# Patient Record
Sex: Male | Born: 1975 | Hispanic: Yes | Marital: Married | Smoking: Current every day smoker
Health system: Southern US, Community
[De-identification: ages and names within clinical notes are randomized; demographics above are authoritative.]

---

## 2016-10-04 DIAGNOSIS — M25531 Pain in right wrist: Secondary | ICD-10-CM

## 2016-10-19 DIAGNOSIS — M25531 Pain in right wrist: Secondary | ICD-10-CM | POA: Diagnosis not present

## 2016-10-19 NOTE — Progress Notes (Signed)
   Office Visit Note   Patient: Allen Bryant           Date of Birth: 03/31/1976           MRN: [id number] Visit Date: 10/19/2016              Requested by: No referring provider defined for this encounter. PCP: No primary care provider on file.   Assessment & Plan: Visit Diagnoses: No diagnosis found.  Plan: Total face to face encounter time was greater than 45 minutes and over half of this time was spent in counseling and/or coordination of care.  continue light duty at this point. Recommend MRI of the right wrist to evaluate for extensor tendon tear Follow-up after MRI.  Follow-Up Instructions: No Follow-up on file.   Orders:  No orders of the defined types were placed in this encounter.  No orders of the defined types were placed in this encounter.     Procedures: No procedures performed   Clinical Data: No additional findings.   Subjective: Chief Complaint  Patient presents with  . Right Wrist - Cyst, Pain    Mr. Allen Bryant is a Spanish-speaking only healthy gentleman who comes in with right wrist pain from 10/04/2016. He was on the job when he was lifting something and he felt an immediate pain. His pain is 3 out of 10 and that comes and goes and is sharp at times. He endorses occasional tingling. Currently working light duty. He does not use his right hand. He's been using a brace. He feels that he is a little bit better. He's been using warm compresses and salt soaks. Ibuprofen is also given him partial relief. Denies any constitutional symptoms.    Review of Systems  Constitutional: Negative.   HENT: Negative.   Eyes: Negative.   Respiratory: Negative.   Cardiovascular: Negative.   Gastrointestinal: Negative.   Endocrine: Negative.   Genitourinary: Negative.   Musculoskeletal: Negative.   Skin: Negative.   Allergic/Immunologic: Negative.   Neurological: Negative.   Hematological: Negative.   Psychiatric/Behavioral: Negative.      Objective: Vital  Signs: There were no vitals taken for this visit.  Physical Exam  Constitutional: He is oriented to person, place, and time. He appears well-developed and well-nourished.  HENT:  Head: Normocephalic and atraumatic.  Eyes: EOM are normal.  Neck: Neck supple.  Cardiovascular: Intact distal pulses.   Pulmonary/Chest: Effort normal.  Abdominal: Soft.  Neurological: He is alert and oriented to person, place, and time.  Skin: Skin is warm.  Psychiatric: He has a normal mood and affect. His behavior is normal. Judgment and thought content normal.  Nursing note and vitals reviewed.   Ortho Exam Exam of the right hand and wrist shows weak wrist extension. He has pain with resisted third and second finger extension. Negative Finkelstein's negative intersection syndrome nontender over the scaphoid. He has no focal deficits. Specialty Comments:  No specialty comments available.  Imaging: No results found.   PMFS History: There are no active problems to display for this patient.  No past medical history on file.  No family history on file.  No past surgical history on file. Social History   Occupational History  . Not on file.   Social History Main Topics  . Smoking status: Former Games developermoker  . Smokeless tobacco: Never Used  . Alcohol use Not on file  . Drug use: Unknown  . Sexual activity: Not on file

## 2016-11-02 DIAGNOSIS — M25531 Pain in right wrist: Secondary | ICD-10-CM

## 2016-11-02 NOTE — Progress Notes (Signed)
Patient rescheduled pending MRI approval by workers comp

## 2016-11-25 NOTE — Telephone Encounter (Signed)
Workers comp. Adjuster called this morning to see if patient MRI was in the system so when he comes in for followup everything goes smoothly.  He had it done at [facility] so I told her to make sure he brings a copy on disk form to be on the safe side.  If you can see it in the system please call Belva Ageeeggy Baker [phone] ext [phone].

## 2016-11-26 DIAGNOSIS — M25531 Pain in right wrist: Secondary | ICD-10-CM | POA: Diagnosis not present

## 2016-12-01 NOTE — Telephone Encounter (Signed)
Patient was seen already.

## 2016-12-17 NOTE — Telephone Encounter (Signed)
Pt adjuster requesting Diction for 12/29 and office note  In english  Needs Treatment plan  [place]   Fax [phone]

## 2016-12-17 NOTE — Telephone Encounter (Signed)
Allen Bryant with [employer] [facility] called needing the 11/26/16 office note faxed to her. The fax# is [phone]   The ph# is [phone]

## 2016-12-17 NOTE — Telephone Encounter (Signed)
faxed

## 2016-12-21 NOTE — Telephone Encounter (Signed)
[place] [phone]

## 2016-12-21 NOTE — Telephone Encounter (Signed)
Received vm from case mgr Melody harris requesting 11/26/16 office note. Faxed to her @ [phone]

## 2016-12-22 NOTE — Telephone Encounter (Signed)
Faxed the notes to her.

## 2016-12-22 NOTE — Telephone Encounter (Signed)
[employer] carrier asking for last ov notes and any orders.  Please Fax to  [phone]  Attn:  Vernona RiegerLaura

## 2016-12-23 NOTE — Telephone Encounter (Signed)
Therapist called and requested results of MRI to be faxed to them this morning fax # [phone]. I looked in the imaging tab and did not see a result or I would have faxed it myself.

## 2016-12-23 NOTE — Telephone Encounter (Signed)
FAXED MRI REPORT TO [phone]

## 2016-12-31 NOTE — Telephone Encounter (Signed)
Refaxed all notes and order for Pt today to fax number [phone]. I had already faxed them once.

## 2017-01-10 DIAGNOSIS — M25531 Pain in right wrist: Secondary | ICD-10-CM | POA: Diagnosis not present

## 2017-01-14 NOTE — Telephone Encounter (Signed)
Pascal LuxLaura Garner from [facility] called this afternoon in regards to needing office notes for 01/10/17. Her CB # [phone]. Thank you   [phone] fax

## 2017-01-14 NOTE — Telephone Encounter (Signed)
Faxed note to [phone]

## 2017-02-21 DIAGNOSIS — B9789 Other viral agents as the cause of diseases classified elsewhere: Secondary | ICD-10-CM

## 2017-02-21 DIAGNOSIS — M25531 Pain in right wrist: Secondary | ICD-10-CM

## 2017-02-21 DIAGNOSIS — J069 Acute upper respiratory infection, unspecified: Secondary | ICD-10-CM

## 2017-02-21 NOTE — Discharge Instructions (Signed)
You most likely have a viral URI, I advise rest, plenty of fluids and management of symptoms with over the counter medicines. For symptoms you may take Tylenol as needed every 4-6 hours for body aches or fever, not to exceed 4,000 mg a day, Take mucinex or mucinex DM ever 12 hours with a full glass of water, you may use an inhaled steroid such as Flonase, 2 sprays each nostril once a day for congestion, or an antihistamine such as Claritin or Zyrtec once a day. For Nausea, I have prescribed Zofran, take 1 tablet under the tongue every 8 hours as needed. For cough, I have prescribed a medication called Tessalon. Take 1 tablet every 8 hours as needed for your cough.  For pain I have prescribed Diclofenac, take 1 tablet twice a day. Should your symptoms worsen or fail to resolve, follow up with your primary care provider or return to clinic.

## 2017-02-21 NOTE — ED Triage Notes (Signed)
Pt has been suffering from nasal congestion and drainage, a cough and a headache for 4 days.

## 2017-03-03 DIAGNOSIS — M25531 Pain in right wrist: Secondary | ICD-10-CM | POA: Diagnosis not present

## 2017-03-21 DIAGNOSIS — M25531 Pain in right wrist: Secondary | ICD-10-CM | POA: Diagnosis not present

## 2017-07-09 IMAGING — CR DG WRIST COMPLETE 3+V*R*
4 series · 4 of 4 positions shown · non-contrast
Comparison: None.

CLINICAL DATA: Initial evaluation for acute injury to right wrist,
now with pain and swelling.

EXAM:
RIGHT WRIST - COMPLETE 3+ VIEW

[view not recorded (1 of 4)]
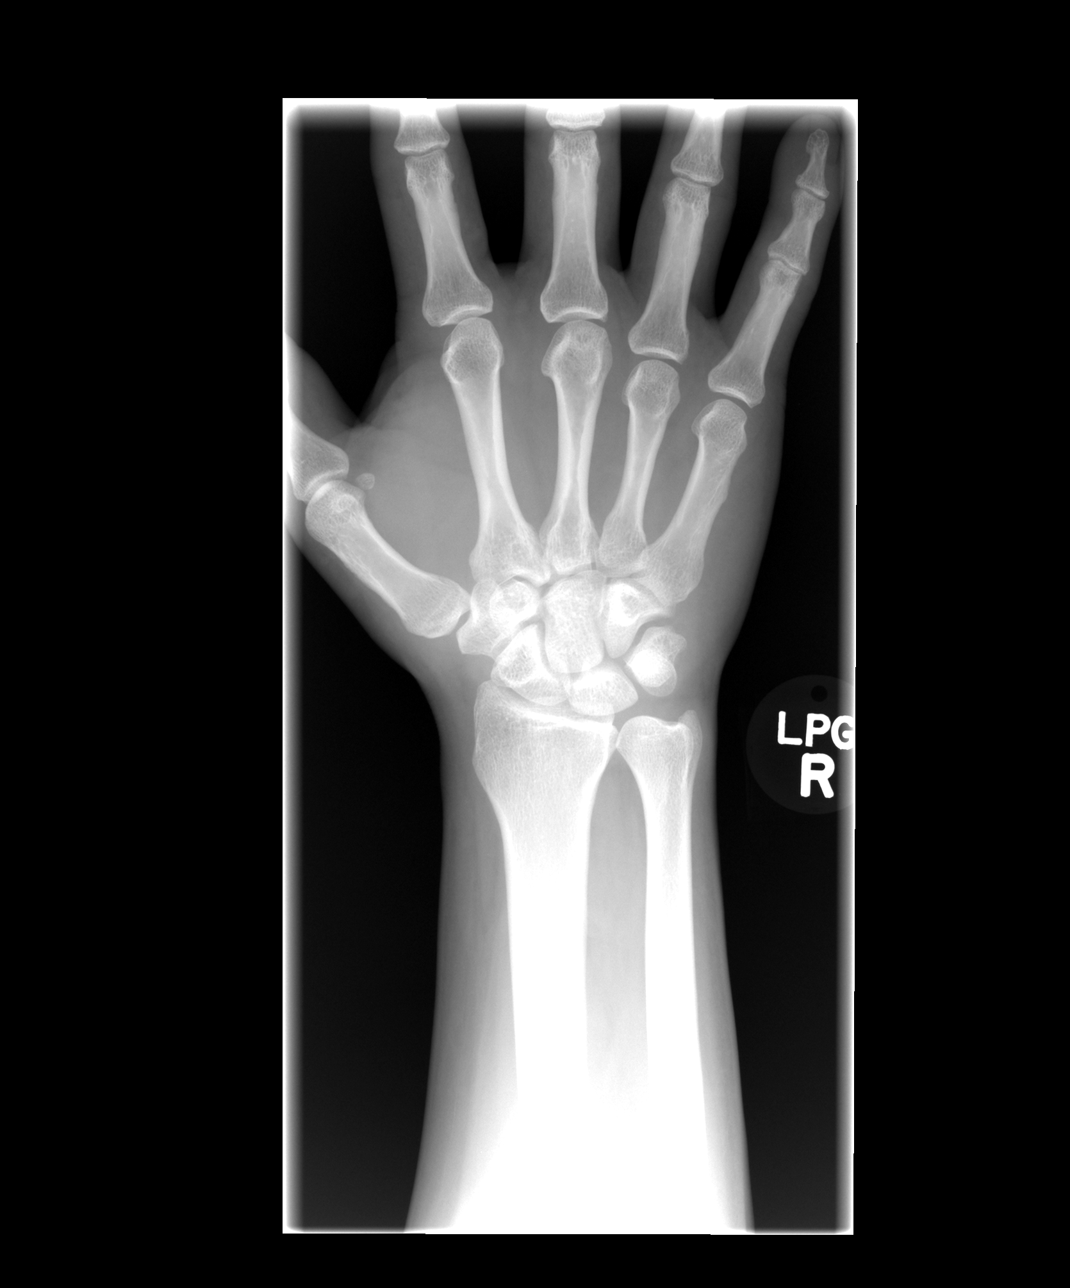

[view not recorded (2 of 4)]
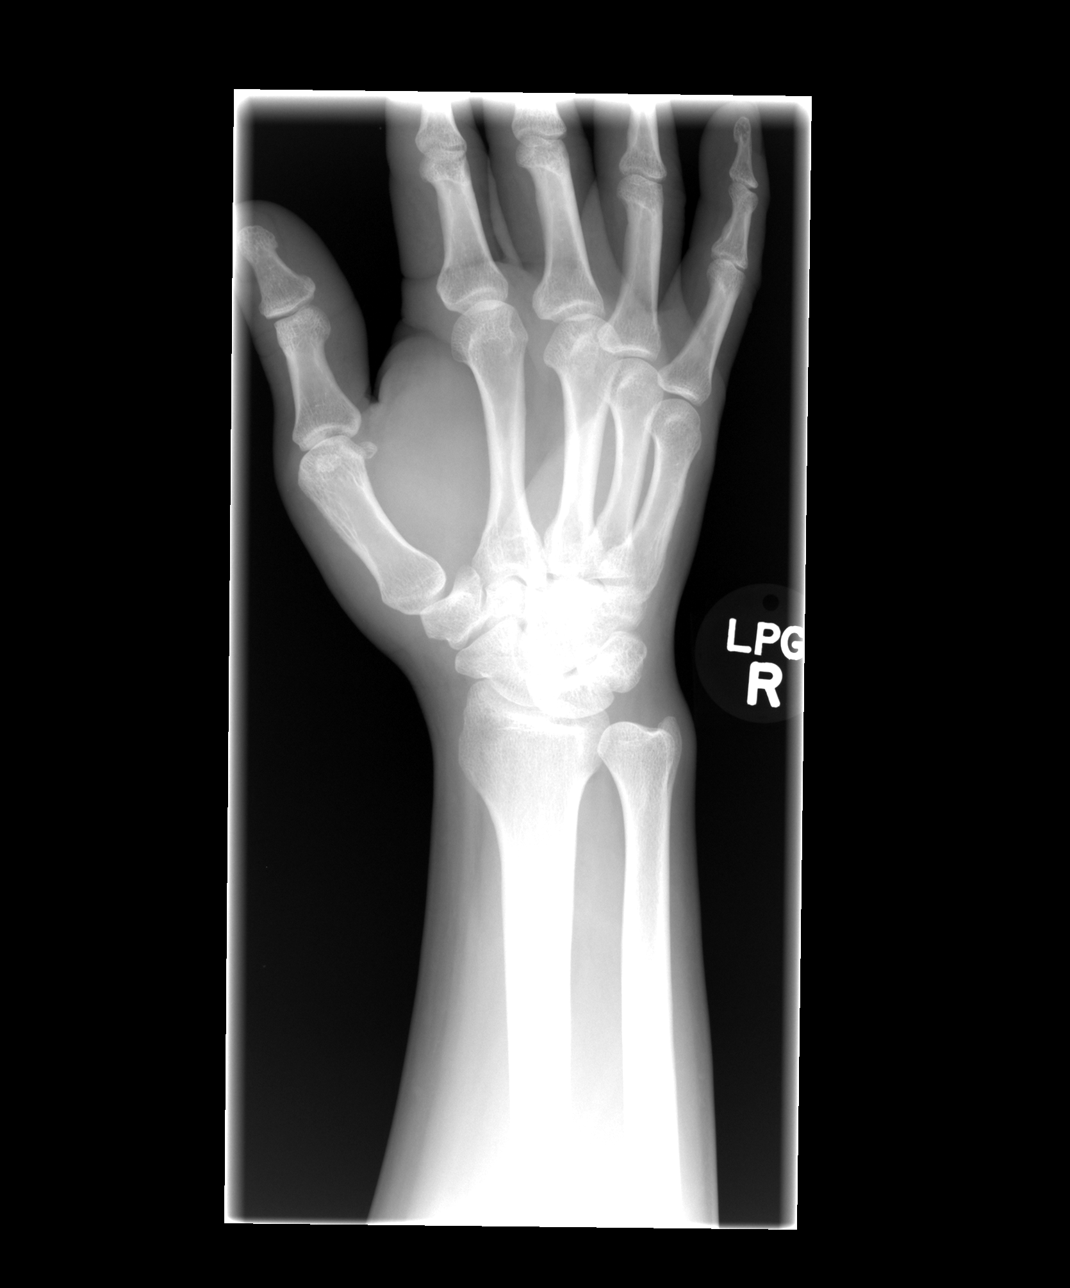

[view not recorded (3 of 4)]
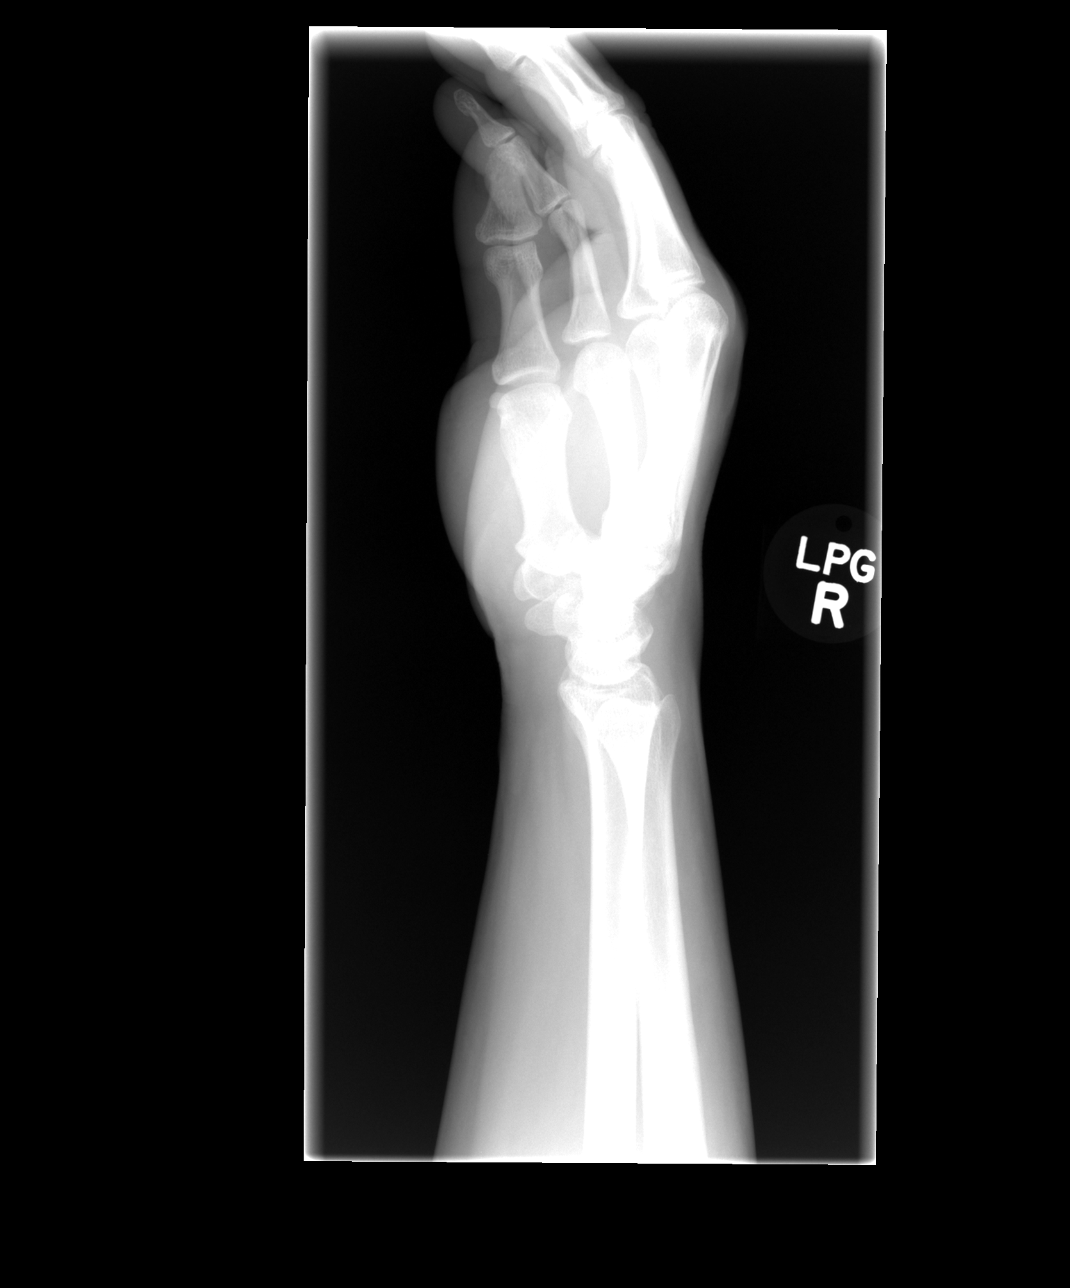

[view not recorded (4 of 4)]
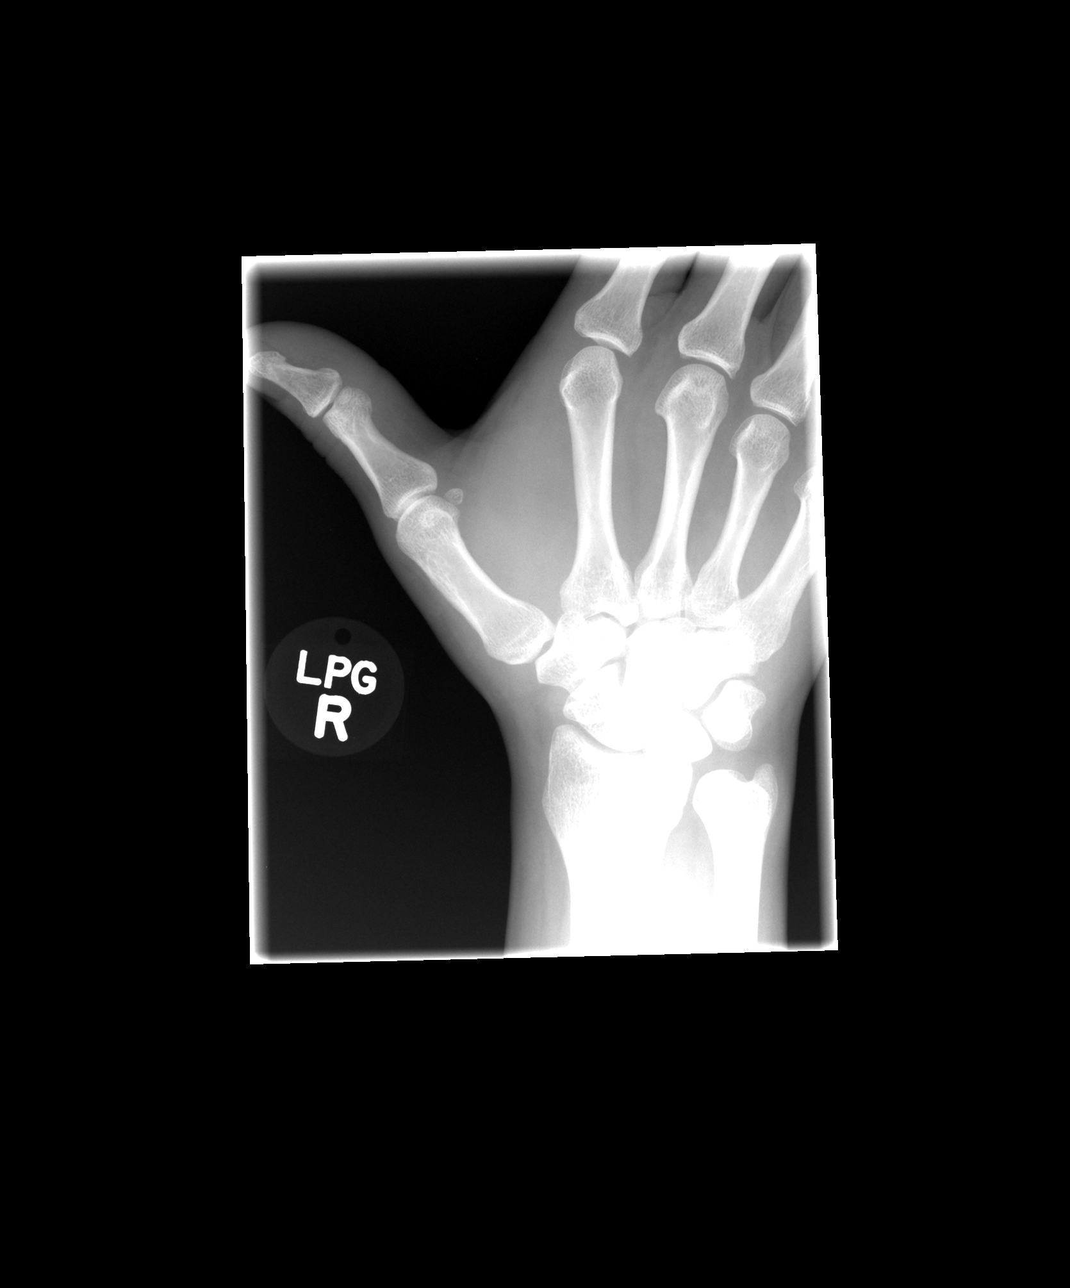

[4 of 4 positions shown; findings below may reference images not displayed]

FINDINGS: No acute fracture or dislocation. Normal radiocarpal and distal
radioulnar articulations intact. No appreciable soft tissue swelling
identified. Osseous mineralization normal. Limited views of the hand
are unremarkable.
IMPRESSION: No acute osseous abnormality about the right wrist.

## 2018-01-02 DIAGNOSIS — F17210 Nicotine dependence, cigarettes, uncomplicated: Secondary | ICD-10-CM | POA: Insufficient documentation

## 2018-01-02 DIAGNOSIS — R740 Nonspecific elevation of levels of transaminase and lactic acid dehydrogenase [LDH]: Secondary | ICD-10-CM | POA: Insufficient documentation

## 2018-01-02 DIAGNOSIS — J111 Influenza due to unidentified influenza virus with other respiratory manifestations: Secondary | ICD-10-CM | POA: Insufficient documentation

## 2018-01-02 DIAGNOSIS — Z79899 Other long term (current) drug therapy: Secondary | ICD-10-CM | POA: Insufficient documentation

## 2018-01-02 NOTE — ED Triage Notes (Signed)
Pt to ED c/o fever and cough that started yesterday. Took 2 ibuprofen PTA, non-productive cough. Resp e/u, skin warm/dry.

## 2018-01-03 DIAGNOSIS — J111 Influenza due to unidentified influenza virus with other respiratory manifestations: Secondary | ICD-10-CM

## 2018-01-03 DIAGNOSIS — R69 Illness, unspecified: Secondary | ICD-10-CM

## 2018-01-03 DIAGNOSIS — R05 Cough: Secondary | ICD-10-CM

## 2018-01-03 DIAGNOSIS — R7401 Elevation of levels of liver transaminase levels: Secondary | ICD-10-CM

## 2018-01-03 DIAGNOSIS — R740 Nonspecific elevation of levels of transaminase and lactic acid dehydrogenase [LDH]: Secondary | ICD-10-CM

## 2018-01-03 DIAGNOSIS — R059 Cough, unspecified: Secondary | ICD-10-CM

## 2018-01-03 NOTE — Discharge Instructions (Addendum)
Today your liver enzymes were elevated.  You need to follow up with [facility]. Please do not consume alcohol or tylenol as this can worsen liver disease.  I have drawn blood to check for hepatitis, you can follow up on this with [facility].  Please have your liver function re-checked in one week.   Please make sure you drink plenty of water.

## 2018-01-03 NOTE — ED Notes (Signed)
Per Dr. Elesa MassedWard OK to give 800mg  Advil for fever

## 2018-01-06 DIAGNOSIS — H1033 Unspecified acute conjunctivitis, bilateral: Secondary | ICD-10-CM

## 2018-01-06 NOTE — Discharge Instructions (Signed)
Use ofloxacin eyedrops as directed on both eyes. Artificial tear gel at night. Wait 10-15 minutes between drops, always use artificial tear gel last, as it prevents drops from penetrating through. Lid scrubs and warm compresses as directed. Monitor for any worsening of symptoms, changes in vision, sensitivity to light, eye swelling, follow up with ophthalmology for further evaluation.   As discussed, I redness could also be due to a virus passing through as you are being treated for flu.  In that case, antibiotics drop may not improve the symptoms.

## 2018-01-06 NOTE — ED Triage Notes (Signed)
PT C/O: bilateral eye pain onset 3-4 days associated irritation, redness, watery  DENIES: inj/trauma   TAKING MEDS: OTC Visine eye drops   A&O x4... NAD... Ambulatory

## 2018-10-07 IMAGING — CR DG CHEST 2V
2 series · 2 of 2 positions shown · non-contrast
Comparison: None.

CLINICAL DATA: Acute onset of fever and nonproductive cough.

EXAM:
CHEST  2 VIEW

[chest pa]
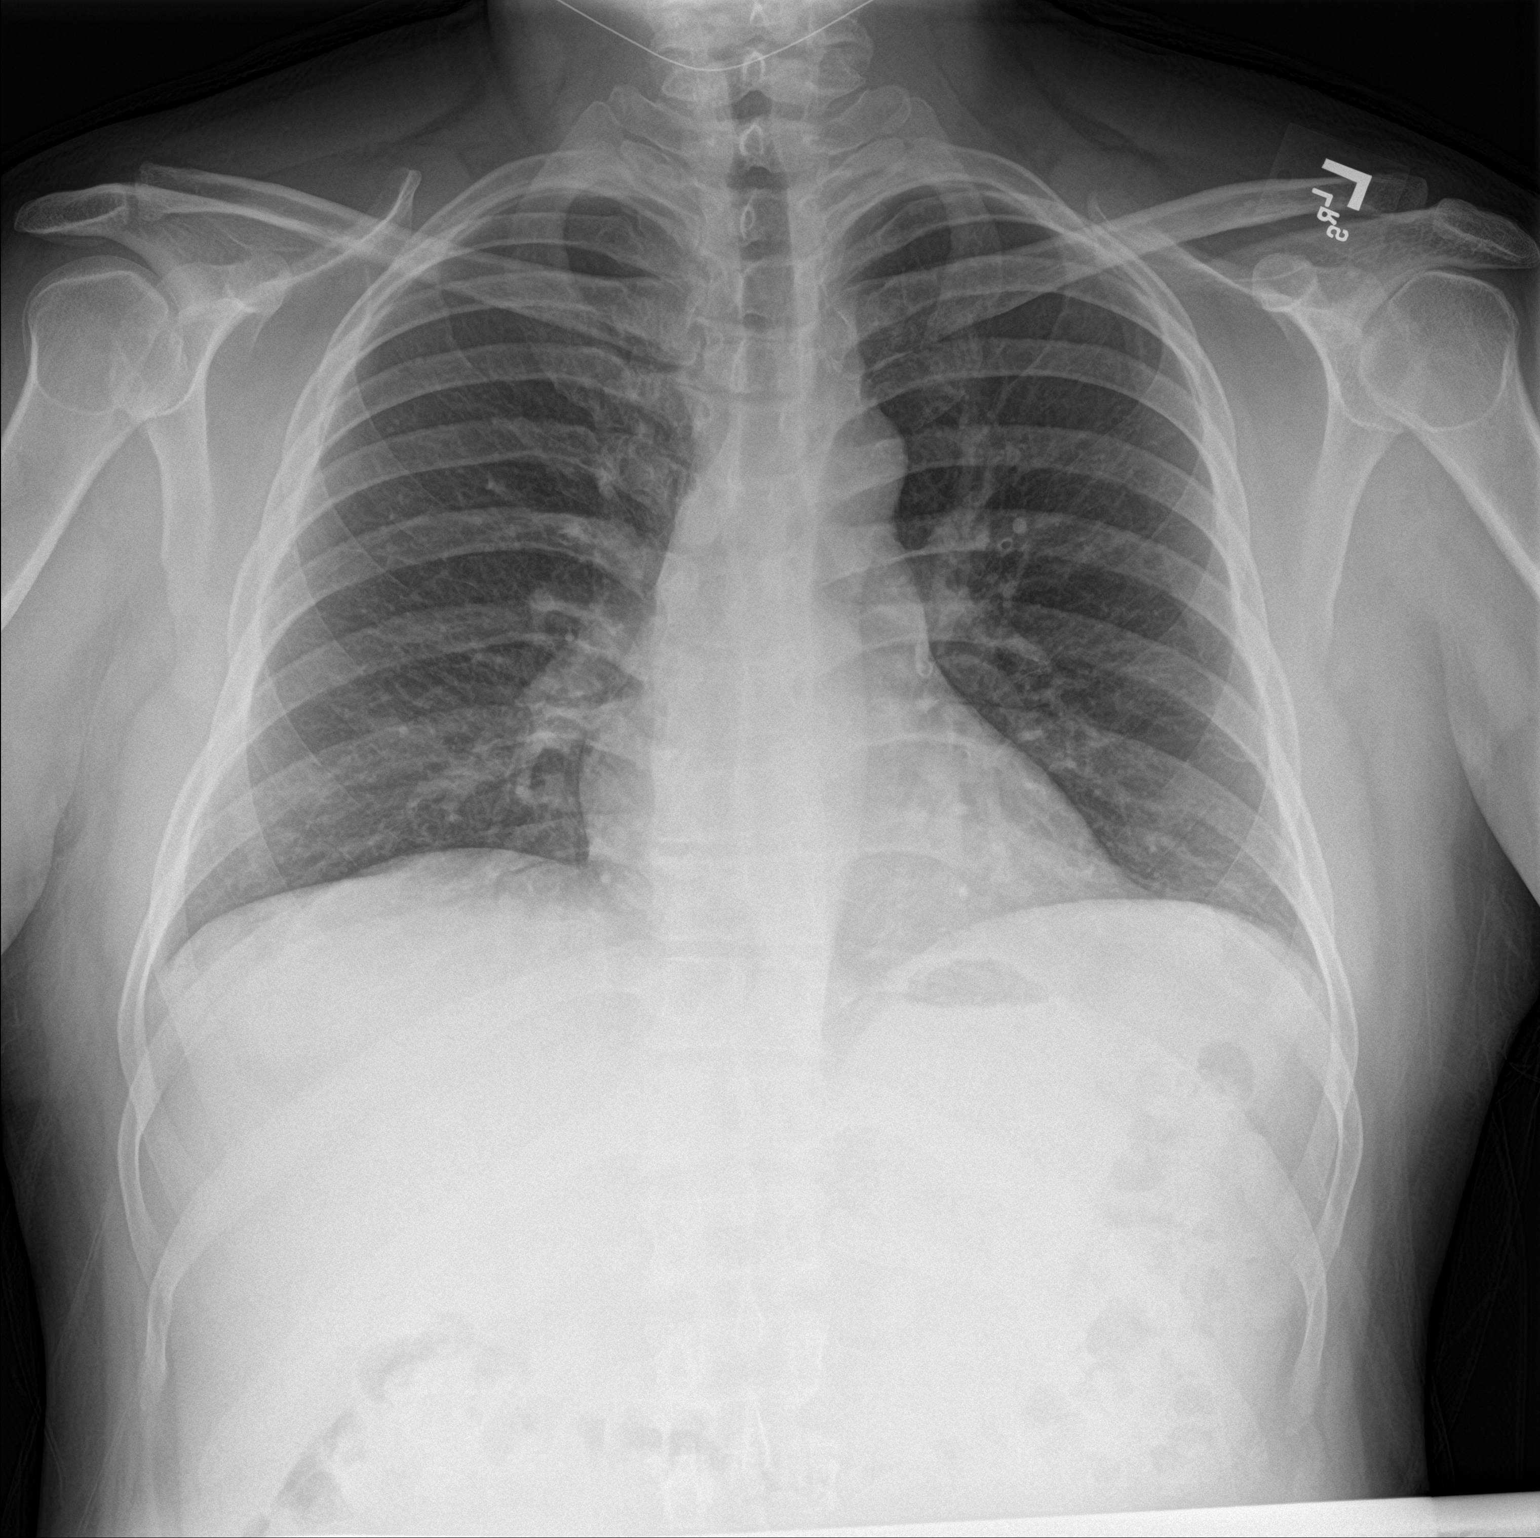

[chest lat]
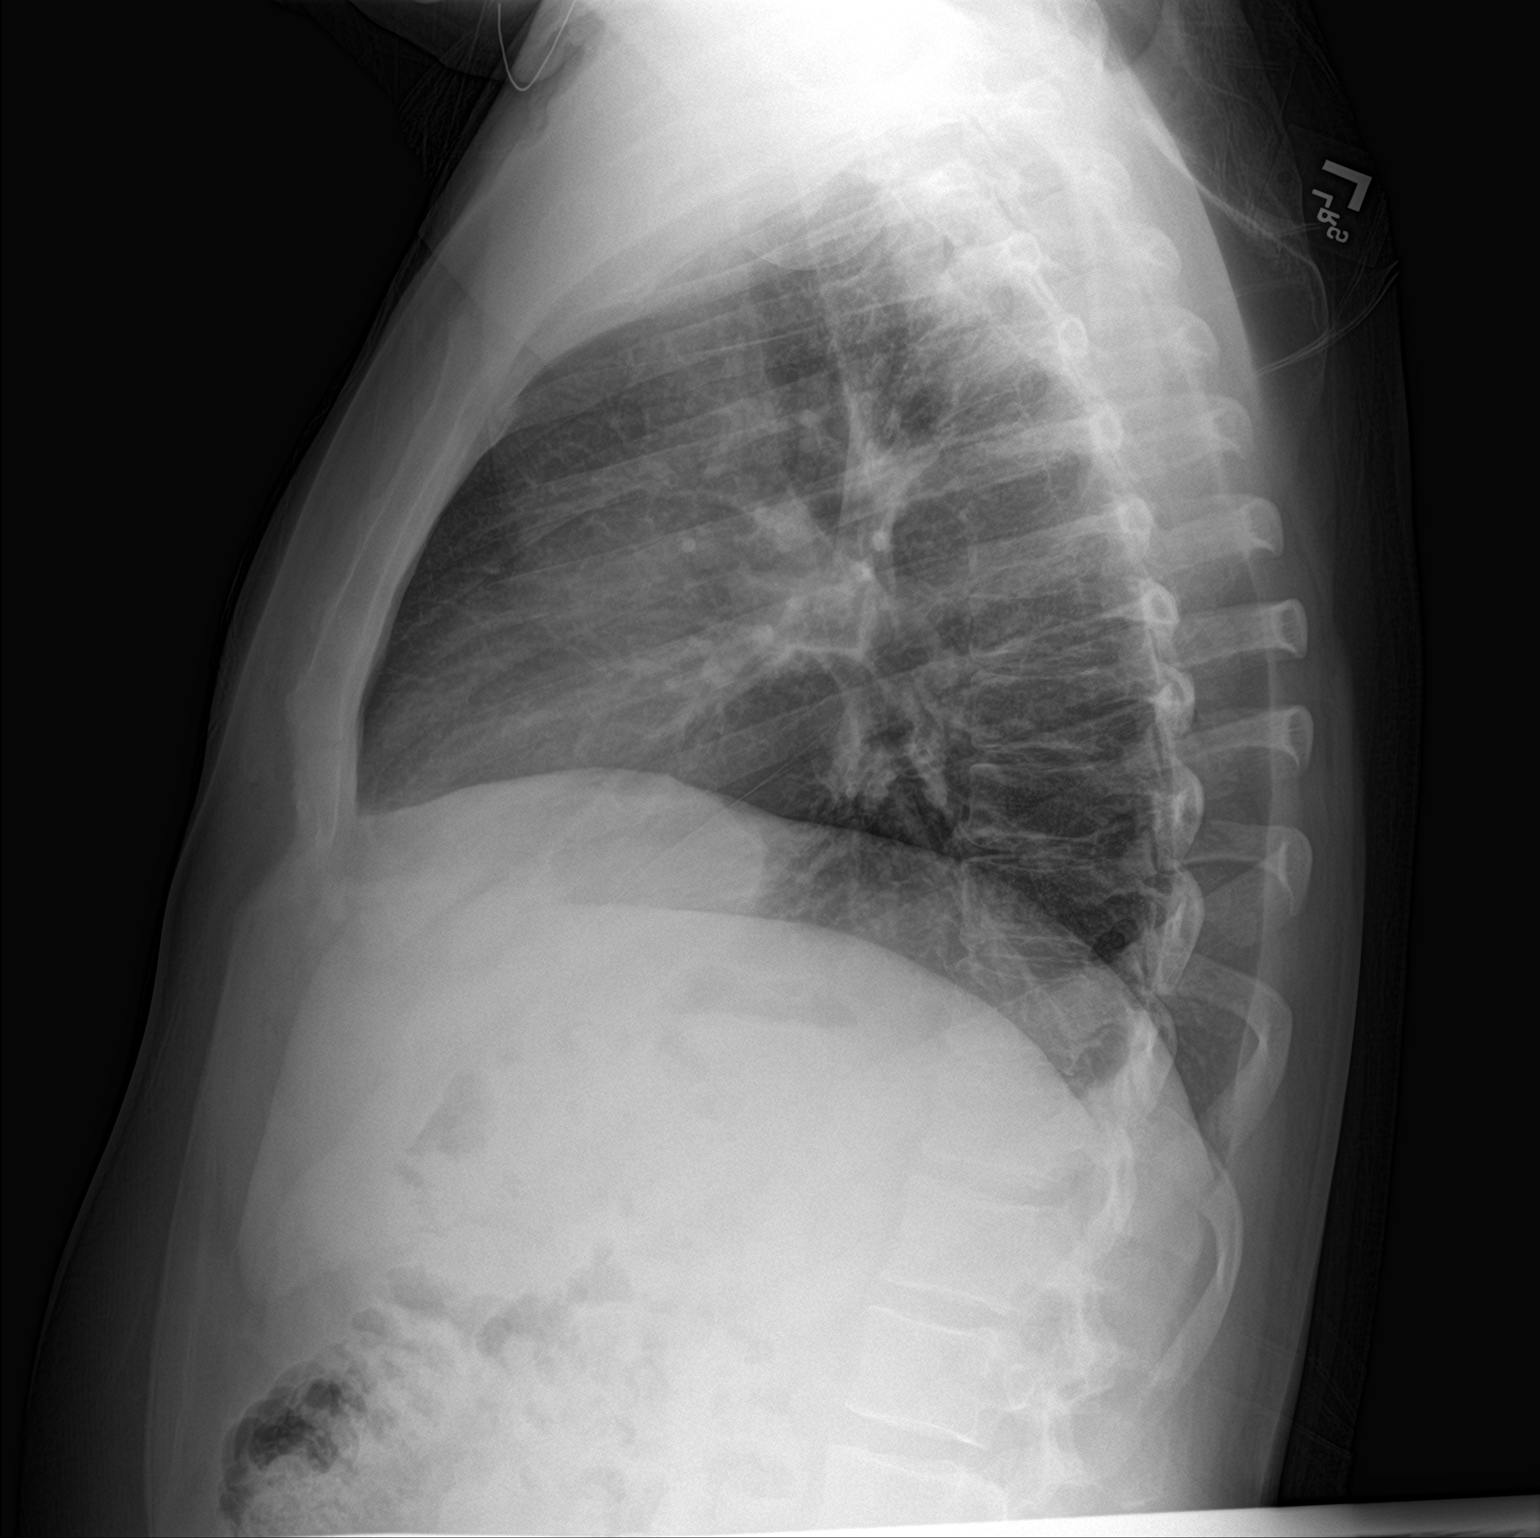

[2 of 2 positions shown; findings below may reference images not displayed]

FINDINGS: The lungs are well-aerated and clear. There is no evidence of focal
opacification, pleural effusion or pneumothorax.

The heart is normal in size; the mediastinal contour is within
normal limits. No acute osseous abnormalities are seen.
IMPRESSION: No acute cardiopulmonary process seen.
